# Patient Record
Sex: Male | Born: 1990 | Race: Black or African American | Hispanic: No | Marital: Single | Smoking: Never smoker
Health system: Southern US, Community
[De-identification: ages and names within clinical notes are randomized; demographics above are authoritative.]

---

## 2018-06-06 ENCOUNTER — Other Ambulatory Visit: Payer: Self-pay | Admitting: Occupational Medicine

## 2018-06-06 ENCOUNTER — Ambulatory Visit: Payer: Self-pay

## 2018-06-06 DIAGNOSIS — R519 Headache, unspecified: Secondary | ICD-10-CM

## 2018-06-06 DIAGNOSIS — R51 Headache: Principal | ICD-10-CM

## 2018-06-21 ENCOUNTER — Other Ambulatory Visit: Payer: Self-pay

## 2018-06-21 ENCOUNTER — Encounter (HOSPITAL_COMMUNITY): Payer: Self-pay

## 2018-06-21 ENCOUNTER — Emergency Department (HOSPITAL_COMMUNITY)
Admission: EM | Admit: 2018-06-21 | Discharge: 2018-06-21 | Disposition: A | Discharge status: Home / Self Care | Payer: Managed Care, Other (non HMO) | Attending: Emergency Medicine | Admitting: Emergency Medicine

## 2018-06-21 DIAGNOSIS — I10 Essential (primary) hypertension: Secondary | ICD-10-CM | POA: Diagnosis present

## 2018-06-21 LAB — BASIC METABOLIC PANEL
ANION GAP: 12 (ref 5–15)
BUN: 11 mg/dL (ref 6–20)
CO2: 25 mmol/L (ref 22–32)
Calcium: 9.2 mg/dL (ref 8.9–10.3)
Chloride: 102 mmol/L (ref 98–111)
Creatinine, Ser: 0.98 mg/dL (ref 0.61–1.24)
GFR calc Af Amer: >60 mL/min (ref >60)
GLUCOSE: 91 mg/dL (ref 70–99)
POTASSIUM: 3.5 mmol/L (ref 3.5–5.1)
SODIUM: 139 mmol/L (ref 135–145)

## 2018-06-21 LAB — CBC WITH DIFFERENTIAL/PLATELET
BASOS ABS: 0 10*3/uL (ref 0.0–0.1)
BASOS PCT: 0 %
EOS PCT: 1 %
Eosinophils Absolute: 0 10*3/uL (ref 0.0–0.7)
HEMATOCRIT: 43.2 % (ref 39.0–52.0)
Hemoglobin: 14.4 g/dL (ref 13.0–17.0)
Lymphocytes Relative: 55 %
Lymphs Abs: 2.1 10*3/uL (ref 0.7–4.0)
MCH: 26.4 pg (ref 26.0–34.0)
MCHC: 33.3 g/dL (ref 30.0–36.0)
MCV: 79.3 fL (ref 78.0–100.0)
MONO ABS: 0.5 10*3/uL (ref 0.1–1.0)
Monocytes Relative: 14 %
NEUTROS ABS: 1.1 10*3/uL — AB (ref 1.7–7.7)
Neutrophils Relative %: 30 %
PLATELETS: 370 10*3/uL (ref 150–400)
RBC: 5.45 MIL/uL (ref 4.22–5.81)
RDW: 12.7 % (ref 11.5–15.5)
WBC: 3.8 10*3/uL — AB (ref 4.0–10.5)

## 2018-06-21 MED ORDER — HYDROCHLOROTHIAZIDE 12.5 MG PO TABS: 12.5000 mg | ORAL_TABLET | Freq: Every day | ORAL | 1 refills | Status: AC

## 2018-06-21 NOTE — Discharge Instructions (Signed)
If you develop a severe headache, chest pain, blurry vision, vomiting, or any other new/concerning symptoms then return to the ER for evaluation.  Otherwise it is very important to follow-up with a primary care physician for reevaluation of your blood pressure.

## 2018-06-21 NOTE — ED Triage Notes (Signed)
Pt states that his BP has been high for about a week, since he hit himself accidentally in the head with a crowbar.

## 2018-06-21 NOTE — ED Provider Notes (Signed)
[facility]-EMERGENCY DEPT Provider Note   CSN: [id number] Arrival date & time: 06/21/18  7829     History   Chief Complaint Chief Complaint  Patient presents with  . Hypertension    HPI Dee Steeno is a 27 y.o. male.  HPI  27 year old male presents for evaluation of hypertension.  He accidentally injured his face a week and a half ago and suffered a zygoma fracture on the left side.  Since then he is been feeling a little dizzy on and off.  He feels like it is a spatial problem but denies a room spinning sensation or feeling like he is going to pass out.  No headaches.  Followed up with ENT and when they evaluated him they told him his blood pressure was high.  According to the chart, it was 153/115.  He has been checking his blood pressure at work and has been running around 140 or 145/90 or 110.  He denies any significant past medical history or family history of hypertension.  He states before this injury he had been checking his blood pressure notes typically around 120.  No chest pain or weakness/numbness.  No blurry vision.  History reviewed. No pertinent past medical history.  There are no active problems to display for this patient.   History reviewed. No pertinent surgical history.      Home Medications    Prior to Admission medications   Not on File    Family History No family history on file.  Social History Social History   Tobacco Use  . Smoking status: Never Smoker  . Smokeless tobacco: Never Used  Substance Use Topics  . Alcohol use: Yes    Comment: socially  . Drug use: Never     Allergies   Shellfish allergy   Review of Systems Review of Systems  Eyes: Negative for visual disturbance.  Respiratory: Negative for shortness of breath.   Cardiovascular: Negative for chest pain.  Neurological: Positive for dizziness. Negative for weakness, numbness and headaches.  All other systems reviewed and are  negative.    Physical Exam Updated Vital Signs BP (!) 159/99 (BP Location: Left Arm)   Pulse 94   Temp 99 F (37.2 C) (Oral)   Resp 18   Ht 6\' 4"  (1.93 m)   Wt 83.9 kg   SpO2 100%   BMI 22.52 kg/m   Physical Exam  Constitutional: He is oriented to person, place, and time. He appears well-developed and well-nourished. No distress.  HENT:  Head: Normocephalic and atraumatic.  Right Ear: External ear normal.  Left Ear: External ear normal.  Nose: Nose normal.  Eyes: Pupils are equal, round, and reactive to light. EOM are normal. Right eye exhibits no discharge. Left eye exhibits no discharge.  Neck: Neck supple.  Cardiovascular: Normal rate, regular rhythm and normal heart sounds.  Pulmonary/Chest: Effort normal and breath sounds normal.  Abdominal: Soft. There is no tenderness.  Musculoskeletal: He exhibits no edema.  Neurological: He is alert and oriented to person, place, and time.  CN 3-12 grossly intact. 5/5 strength in all 4 extremities. Grossly normal sensation. Normal finger to nose.   Skin: Skin is warm and dry. He is not diaphoretic.  Nursing note and vitals reviewed.    ED Treatments / Results  Labs (all labs ordered are listed, but only abnormal results are displayed) Labs Reviewed  CBC WITH DIFFERENTIAL/PLATELET - Abnormal; Notable for the following components:      Result Value  WBC 3.8 (*)    Neutro Abs 1.1 (*)    All other components within normal limits  BASIC METABOLIC PANEL    EKG None  Radiology No results found.  Procedures Procedures (including critical care time)  Medications Ordered in ED Medications - No data to display   Initial Impression / Assessment and Plan / ED Course  I have reviewed the triage vital signs and the nursing notes.  Pertinent labs & imaging results that were available during my care of the patient were reviewed by me and considered in my medical decision making (see chart for details).     Patient appears  well and has a benign exam here.  He has 2 separate blood pressures documented at over 150 systolic.  He also has significant diastolic elevation.  Given this, after discussion with patient, we will start on blood pressure medicine.  I discussed the importance of following up with a PCP and changing diet.  Otherwise he appears stable for discharge home with return precautions.  Final Clinical Impressions(s) / ED Diagnoses   Final diagnoses:  Essential hypertension    ED Discharge Orders    None       Pricilla Loveless, MD 06/21/18 1123

## 2020-07-26 IMAGING — DX DG FACIAL BONES COMPLETE 3+V
4 series · 4 of 4 positions shown · non-contrast
Comparison: None.

CLINICAL DATA: 27-year-old male status post blunt trauma to the
left infraorbital area 5 days ago with bruising and persistent pain.

EXAM:
FACIAL BONES COMPLETE 3+V

[skull pa]
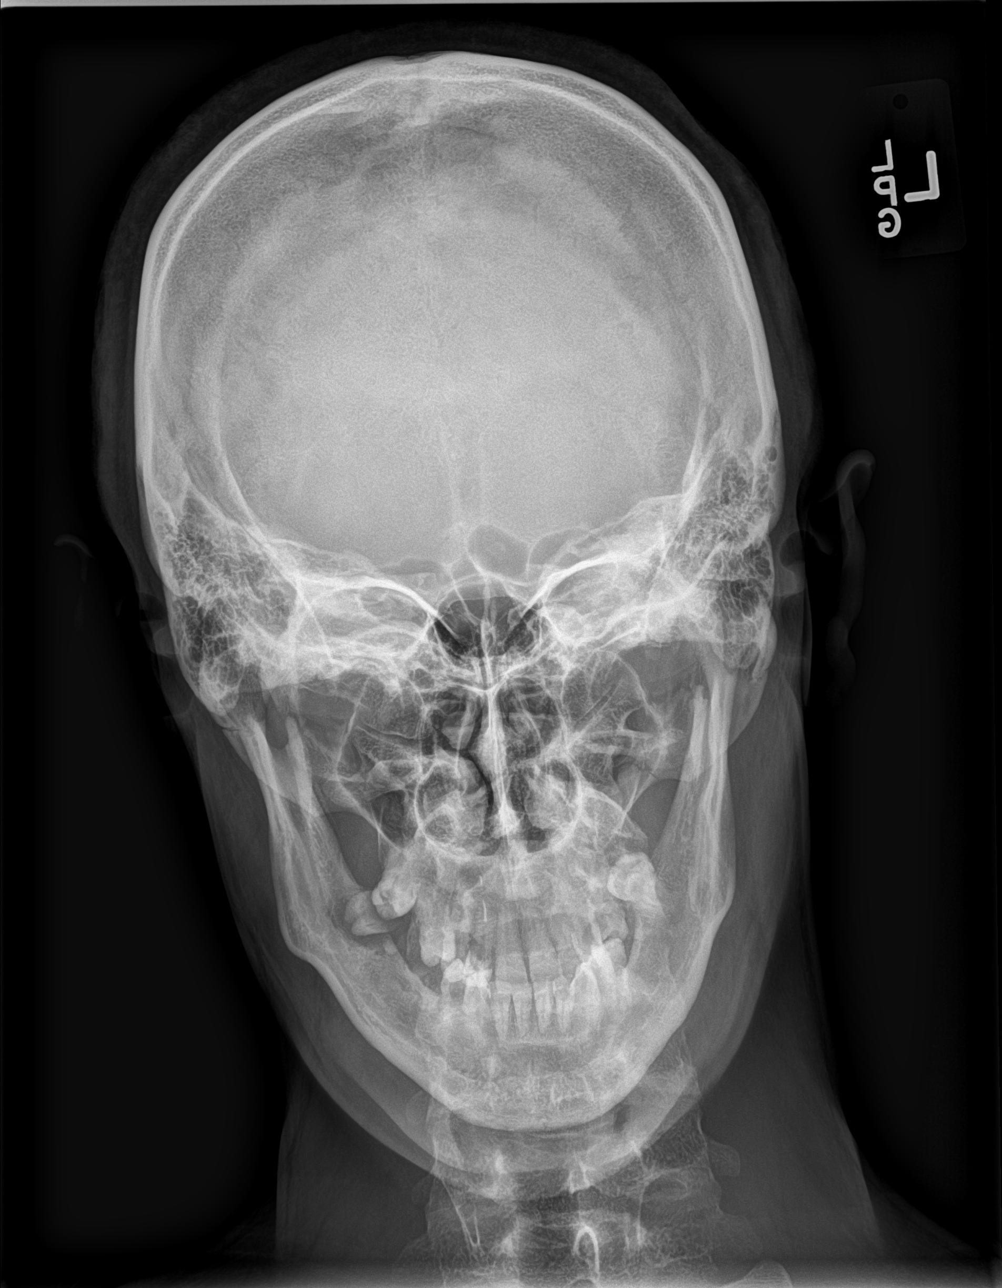

[skull lat]
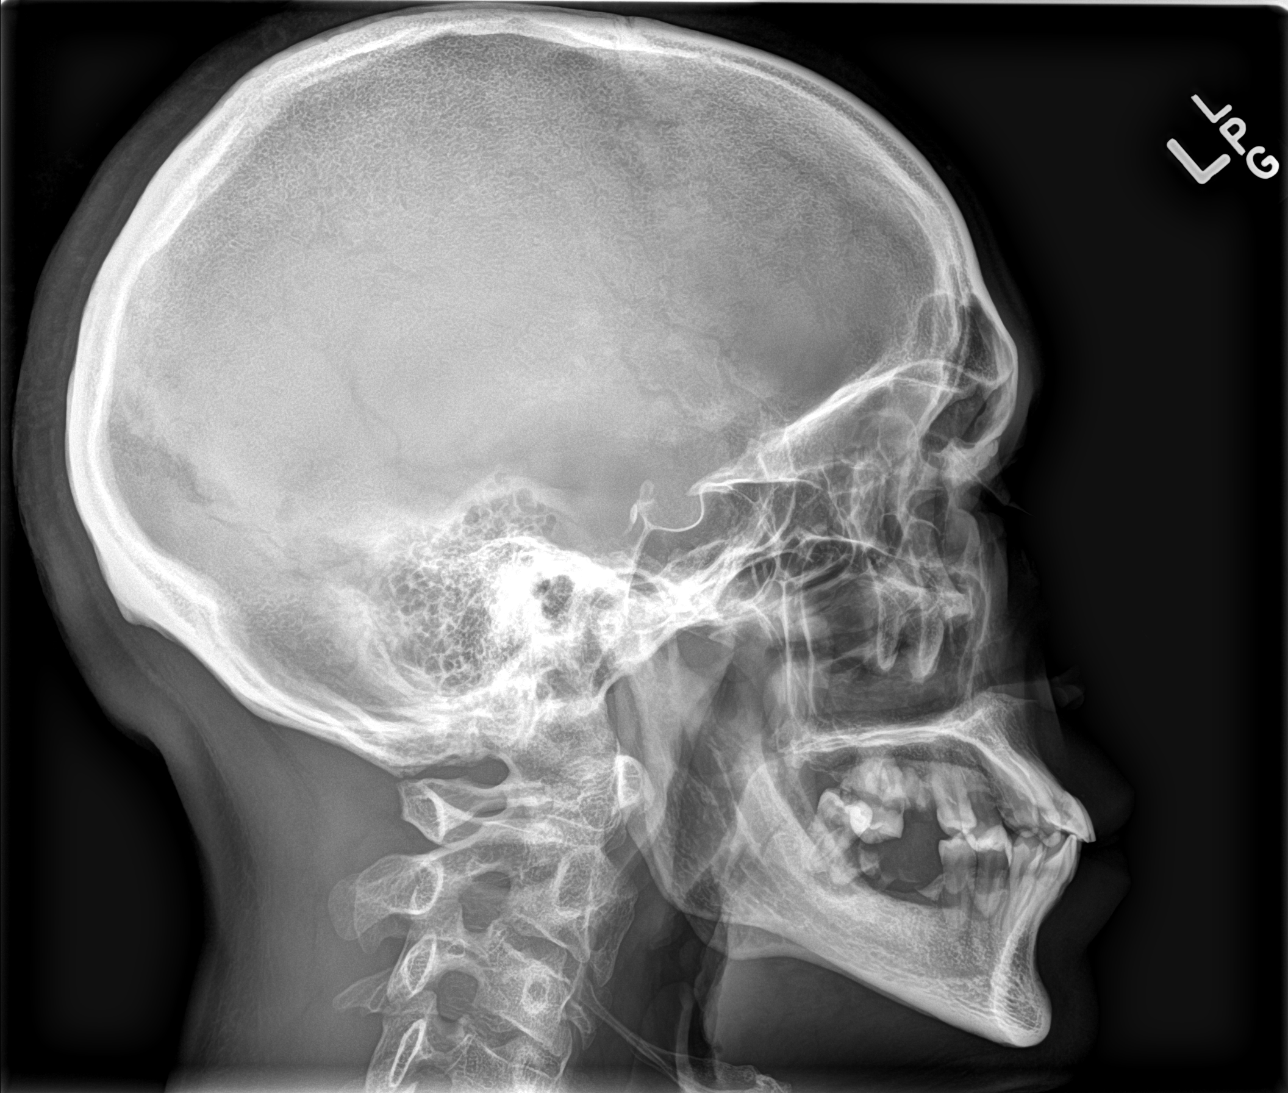

[skull waters]
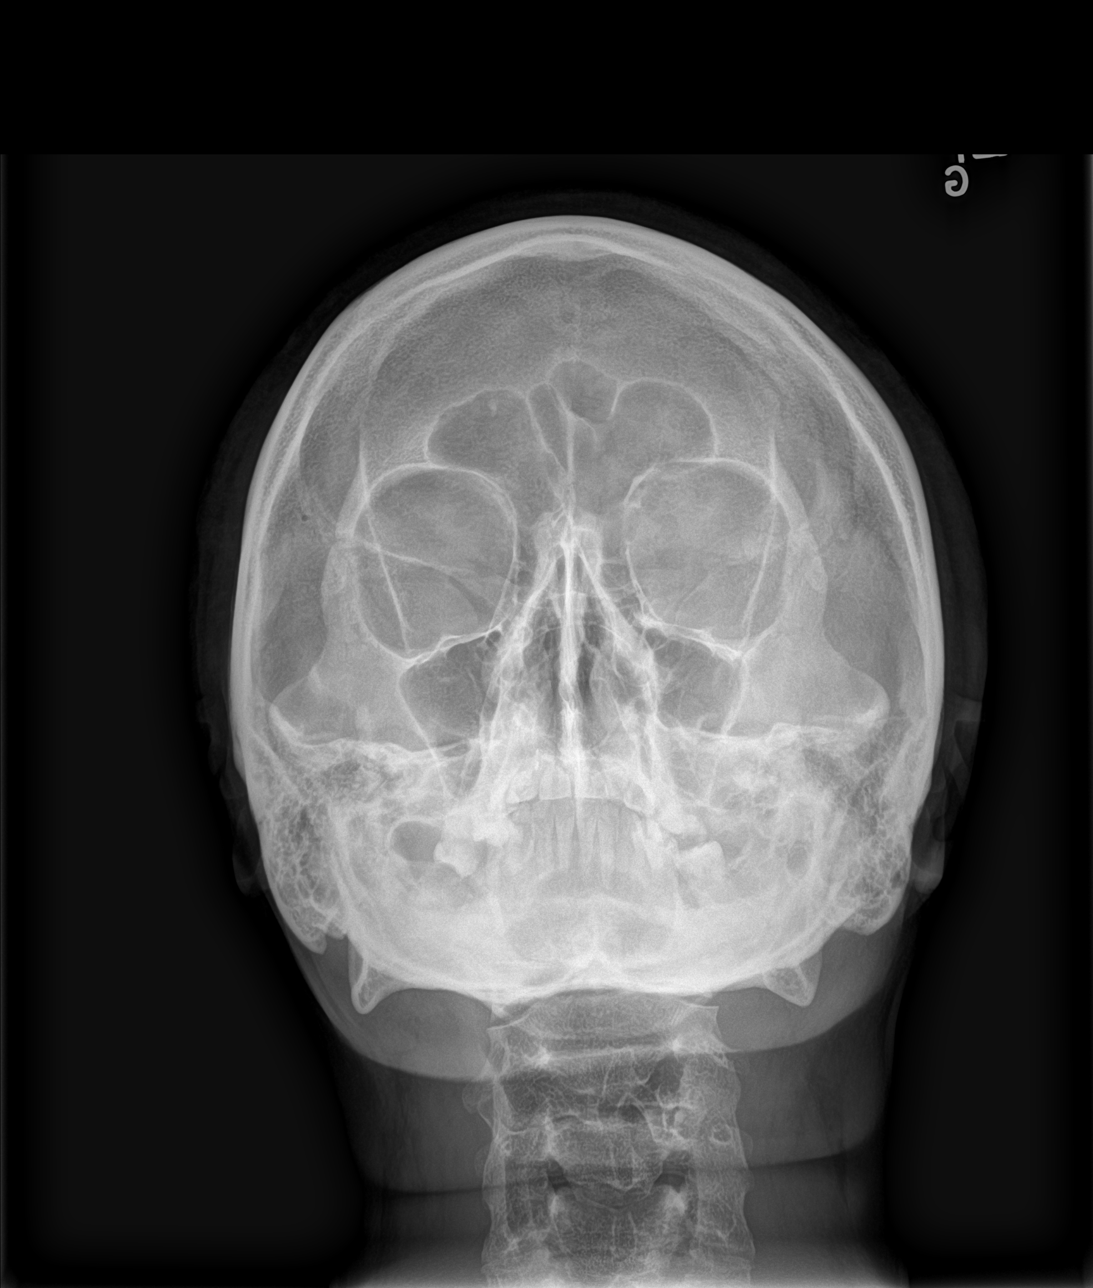

[skull towns]
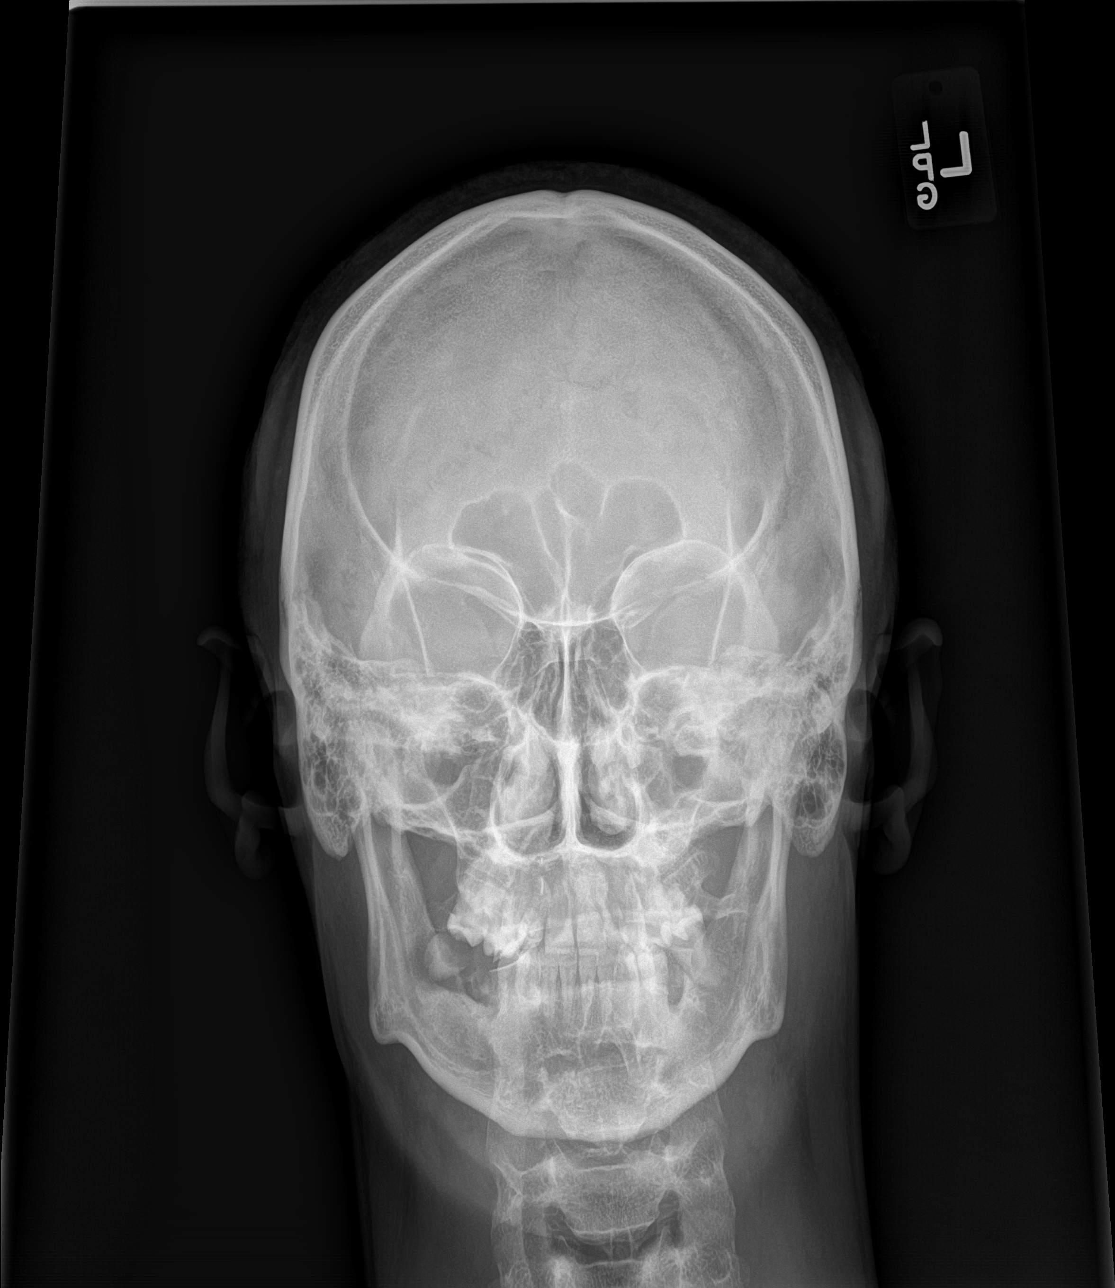

[4 of 4 positions shown; findings below may reference images not displayed]

FINDINGS: There is no evidence of fracture or other significant bone
abnormality. No orbital emphysema. Bilateral paranasal sinuses
appear symmetrically pneumatized. Seen only on the initial image
there is evidence of a nondisplaced linear fracture through what
appears to be the left zygomaticomaxillary confluence (arrow). No
other fracture identified radiographically. There is carious
dentition including a posterior right mandible molar with pronounced
periapical lucency.
IMPRESSION: 1. Appearance highly suspicious for a nondisplaced fracture at the
left-side zygomaticomaxillary confluence. Followup noncontrast Face
CT may be valuable for confirmation and treatment planning.
2. Carious dentition.
